# Patient Record
Sex: Female | Born: 1985 | Race: White | Hispanic: No | Marital: Single | Smoking: Current every day smoker
Health system: Southern US, Community
[De-identification: ages and names within clinical notes are randomized; demographics above are authoritative.]

## PROBLEM LIST (undated history)

## (undated) DIAGNOSIS — N200 Calculus of kidney: Secondary | ICD-10-CM

## (undated) HISTORY — PX: OVARIAN CYST REMOVAL: SHX89

## (undated) HISTORY — PX: WISDOM TOOTH EXTRACTION: SHX21

## (undated) HISTORY — PX: LITHOTRIPSY: SUR834

## (undated) HISTORY — PX: TONSILLECTOMY: SUR1361

---

## 2012-06-09 ENCOUNTER — Emergency Department: Payer: Self-pay | Admitting: Emergency Medicine

## 2012-06-09 LAB — BASIC METABOLIC PANEL
Calcium, Total: 8.8 mg/dL (ref 8.5–10.1)
Chloride: 104 mmol/L (ref 98–107)
Co2: 27 mmol/L (ref 21–32)
Creatinine: 0.64 mg/dL (ref 0.60–1.30)
Glucose: 94 mg/dL (ref 65–99)
Osmolality: 273 (ref 275–301)
Potassium: 3.8 mmol/L (ref 3.5–5.1)

## 2012-06-09 LAB — URINALYSIS, COMPLETE
Bilirubin,UR: NEGATIVE
Ph: 7 (ref 4.5–8.0)
Protein: 30
Squamous Epithelial: 1
WBC UR: 23 /HPF (ref 0–5)

## 2012-06-09 LAB — CBC
HCT: 41.5 % (ref 35.0–47.0)
HGB: 14.8 g/dL (ref 12.0–16.0)
MCHC: 35.6 g/dL (ref 32.0–36.0)
MCV: 98 fL (ref 80–100)
RBC: 4.25 10*6/uL (ref 3.80–5.20)
WBC: 13.3 10*3/uL — ABNORMAL HIGH (ref 3.6–11.0)

## 2012-06-11 LAB — URINE CULTURE

## 2012-06-14 ENCOUNTER — Emergency Department: Payer: Self-pay | Admitting: Internal Medicine

## 2012-06-14 LAB — URINALYSIS, COMPLETE
Nitrite: NEGATIVE
Ph: 6 (ref 4.5–8.0)
RBC,UR: 291 /HPF (ref 0–5)
Specific Gravity: 1.013 (ref 1.003–1.030)
Squamous Epithelial: <1
WBC UR: 9 /HPF (ref 0–5)

## 2012-09-05 ENCOUNTER — Emergency Department: Payer: Self-pay | Admitting: Internal Medicine

## 2012-09-05 LAB — URINALYSIS, COMPLETE
Bacteria: NONE SEEN
Bilirubin,UR: NEGATIVE
Glucose,UR: NEGATIVE mg/dL (ref 0–75)
Ketone: NEGATIVE
Leukocyte Esterase: NEGATIVE
Nitrite: NEGATIVE
Ph: 5 (ref 4.5–8.0)
Protein: NEGATIVE
RBC,UR: 58 /HPF (ref 0–5)
Specific Gravity: 1.02 (ref 1.003–1.030)
Squamous Epithelial: 6
WBC UR: NONE SEEN /HPF (ref 0–5)

## 2012-09-05 LAB — CBC
MCH: 32.8 pg (ref 26.0–34.0)
Platelet: 269 10*3/uL (ref 150–440)
RBC: 4.57 10*6/uL (ref 3.80–5.20)
RDW: 12.9 % (ref 11.5–14.5)
WBC: 12.1 10*3/uL — ABNORMAL HIGH (ref 3.6–11.0)

## 2012-09-05 LAB — COMPREHENSIVE METABOLIC PANEL
Albumin: 4.1 g/dL (ref 3.4–5.0)
Alkaline Phosphatase: 93 U/L (ref 50–136)
Anion Gap: 10 (ref 7–16)
BUN: 6 mg/dL — ABNORMAL LOW (ref 7–18)
Calcium, Total: 9.6 mg/dL (ref 8.5–10.1)
Chloride: 103 mmol/L (ref 98–107)
EGFR (Non-African Amer.): >60
Glucose: 84 mg/dL (ref 65–99)
Osmolality: 271 (ref 275–301)
Potassium: 4.1 mmol/L (ref 3.5–5.1)
SGOT(AST): 39 U/L — ABNORMAL HIGH (ref 15–37)
SGPT (ALT): 40 U/L (ref 12–78)
Sodium: 137 mmol/L (ref 136–145)
Total Protein: 7.9 g/dL (ref 6.4–8.2)

## 2012-09-05 LAB — PREGNANCY, URINE: Pregnancy Test, Urine: NEGATIVE m[IU]/mL

## 2012-09-27 ENCOUNTER — Emergency Department: Payer: Self-pay | Admitting: Emergency Medicine

## 2012-09-27 LAB — URINALYSIS, COMPLETE
Bacteria: NONE SEEN
Bilirubin,UR: NEGATIVE
Glucose,UR: NEGATIVE mg/dL (ref 0–75)
Ketone: NEGATIVE
Protein: 30
RBC,UR: 764 /HPF (ref 0–5)
Specific Gravity: 1.019 (ref 1.003–1.030)
Squamous Epithelial: 1
WBC UR: 31 /HPF (ref 0–5)

## 2012-09-27 LAB — COMPREHENSIVE METABOLIC PANEL
Alkaline Phosphatase: 82 U/L (ref 50–136)
Anion Gap: 8 (ref 7–16)
BUN: 6 mg/dL — ABNORMAL LOW (ref 7–18)
Bilirubin,Total: 0.3 mg/dL (ref 0.2–1.0)
Calcium, Total: 8.7 mg/dL (ref 8.5–10.1)
Chloride: 103 mmol/L (ref 98–107)
Glucose: 72 mg/dL (ref 65–99)
SGOT(AST): 32 U/L (ref 15–37)

## 2012-09-27 LAB — CBC
HCT: 40.6 % (ref 35.0–47.0)
HGB: 14.2 g/dL (ref 12.0–16.0)
MCHC: 35 g/dL (ref 32.0–36.0)
MCV: 98 fL (ref 80–100)
Platelet: 288 10*3/uL (ref 150–440)
RBC: 4.14 10*6/uL (ref 3.80–5.20)
RDW: 13.2 % (ref 11.5–14.5)

## 2012-10-11 ENCOUNTER — Emergency Department: Payer: Self-pay | Admitting: Emergency Medicine

## 2012-10-11 LAB — BASIC METABOLIC PANEL
Calcium, Total: 8.7 mg/dL (ref 8.5–10.1)
Creatinine: 0.64 mg/dL (ref 0.60–1.30)
EGFR (African American): >60
Potassium: 3.9 mmol/L (ref 3.5–5.1)
Sodium: 136 mmol/L (ref 136–145)

## 2012-10-11 LAB — URINALYSIS, COMPLETE
Ketone: NEGATIVE
Leukocyte Esterase: NEGATIVE
Ph: 6 (ref 4.5–8.0)
Protein: 100
Specific Gravity: 1.021 (ref 1.003–1.030)
WBC UR: NONE SEEN /HPF (ref 0–5)

## 2012-10-11 LAB — CBC
HCT: 46.4 % (ref 35.0–47.0)
HGB: 16 g/dL (ref 12.0–16.0)
MCHC: 34.5 g/dL (ref 32.0–36.0)
MCV: 99 fL (ref 80–100)
Platelet: 287 10*3/uL (ref 150–440)

## 2012-11-28 ENCOUNTER — Emergency Department: Payer: Self-pay | Admitting: Emergency Medicine

## 2012-11-28 LAB — URINALYSIS, COMPLETE
Bilirubin,UR: NEGATIVE
Glucose,UR: NEGATIVE mg/dL (ref 0–75)
Ketone: NEGATIVE
Nitrite: NEGATIVE
Protein: 30
RBC,UR: 758 /HPF (ref 0–5)
Specific Gravity: 1.018 (ref 1.003–1.030)
WBC UR: 4 /HPF (ref 0–5)

## 2012-11-28 LAB — CBC
RBC: 4.12 10*6/uL (ref 3.80–5.20)
RDW: 13.4 % (ref 11.5–14.5)
WBC: 15 10*3/uL — ABNORMAL HIGH (ref 3.6–11.0)

## 2012-11-28 LAB — BASIC METABOLIC PANEL
Anion Gap: 7 (ref 7–16)
BUN: 8 mg/dL (ref 7–18)
Chloride: 108 mmol/L — ABNORMAL HIGH (ref 98–107)
Co2: 24 mmol/L (ref 21–32)
Creatinine: 0.53 mg/dL — ABNORMAL LOW (ref 0.60–1.30)
EGFR (African American): >60
Glucose: 97 mg/dL (ref 65–99)
Potassium: 3.7 mmol/L (ref 3.5–5.1)
Sodium: 139 mmol/L (ref 136–145)

## 2013-01-26 ENCOUNTER — Emergency Department: Payer: Self-pay | Admitting: Emergency Medicine

## 2013-01-26 LAB — COMPREHENSIVE METABOLIC PANEL
Anion Gap: 7 (ref 7–16)
Bilirubin,Total: 0.2 mg/dL (ref 0.2–1.0)
Chloride: 107 mmol/L (ref 98–107)
Co2: 23 mmol/L (ref 21–32)
Creatinine: 0.77 mg/dL (ref 0.60–1.30)
EGFR (African American): >60
EGFR (Non-African Amer.): >60
Glucose: 79 mg/dL (ref 65–99)
Osmolality: 271 (ref 275–301)
Potassium: 3.9 mmol/L (ref 3.5–5.1)
SGPT (ALT): 27 U/L (ref 12–78)
Sodium: 137 mmol/L (ref 136–145)
Total Protein: 6.8 g/dL (ref 6.4–8.2)

## 2013-01-26 LAB — CBC
MCH: 33.8 pg (ref 26.0–34.0)
MCHC: 34.7 g/dL (ref 32.0–36.0)
MCV: 97 fL (ref 80–100)
Platelet: 285 10*3/uL (ref 150–440)
RBC: 4.06 10*6/uL (ref 3.80–5.20)
RDW: 13.5 % (ref 11.5–14.5)
WBC: 12.2 10*3/uL — ABNORMAL HIGH (ref 3.6–11.0)

## 2013-01-26 LAB — URINALYSIS, COMPLETE
Bilirubin,UR: NEGATIVE
Glucose,UR: NEGATIVE mg/dL (ref 0–75)
Leukocyte Esterase: NEGATIVE
Nitrite: NEGATIVE
WBC UR: 118 /HPF (ref 0–5)

## 2013-01-26 LAB — PREGNANCY, URINE: Pregnancy Test, Urine: NEGATIVE m[IU]/mL

## 2013-03-13 ENCOUNTER — Emergency Department: Payer: Self-pay | Admitting: Emergency Medicine

## 2013-03-13 LAB — COMPREHENSIVE METABOLIC PANEL
ALT: 47 U/L (ref 12–78)
Albumin: 3.9 g/dL (ref 3.4–5.0)
Alkaline Phosphatase: 97 U/L
Anion Gap: 10 (ref 7–16)
BUN: 7 mg/dL (ref 7–18)
Bilirubin,Total: 0.4 mg/dL (ref 0.2–1.0)
CHLORIDE: 103 mmol/L (ref 98–107)
Calcium, Total: 9.1 mg/dL (ref 8.5–10.1)
Co2: 25 mmol/L (ref 21–32)
Creatinine: 0.65 mg/dL (ref 0.60–1.30)
GLUCOSE: 90 mg/dL (ref 65–99)
OSMOLALITY: 273 (ref 275–301)
POTASSIUM: 3.6 mmol/L (ref 3.5–5.1)
SGOT(AST): 41 U/L — ABNORMAL HIGH (ref 15–37)
SODIUM: 138 mmol/L (ref 136–145)
Total Protein: 7.8 g/dL (ref 6.4–8.2)

## 2013-03-13 LAB — CBC
HCT: 45.6 % (ref 35.0–47.0)
HGB: 15.9 g/dL (ref 12.0–16.0)
MCH: 34.9 pg — ABNORMAL HIGH (ref 26.0–34.0)
MCHC: 34.8 g/dL (ref 32.0–36.0)
MCV: 100 fL (ref 80–100)
Platelet: 298 10*3/uL (ref 150–440)
RBC: 4.55 10*6/uL (ref 3.80–5.20)
RDW: 13.1 % (ref 11.5–14.5)
WBC: 11.2 10*3/uL — AB (ref 3.6–11.0)

## 2013-03-13 LAB — URINALYSIS, COMPLETE
Bacteria: NONE SEEN
Bilirubin,UR: NEGATIVE
Glucose,UR: NEGATIVE mg/dL (ref 0–75)
KETONE: NEGATIVE
Leukocyte Esterase: NEGATIVE
NITRITE: NEGATIVE
Ph: 6 (ref 4.5–8.0)
Protein: NEGATIVE
Specific Gravity: 1.006 (ref 1.003–1.030)
Squamous Epithelial: 1

## 2013-04-13 ENCOUNTER — Emergency Department: Payer: Self-pay | Admitting: Emergency Medicine

## 2013-04-13 LAB — URINALYSIS, COMPLETE
Bilirubin,UR: NEGATIVE
Glucose,UR: NEGATIVE mg/dL (ref 0–75)
KETONE: NEGATIVE
LEUKOCYTE ESTERASE: NEGATIVE
Nitrite: NEGATIVE
Ph: 6 (ref 4.5–8.0)
Protein: NEGATIVE
RBC,UR: 1 /HPF (ref 0–5)
Specific Gravity: 1.001 (ref 1.003–1.030)
Squamous Epithelial: NONE SEEN

## 2013-04-13 LAB — COMPREHENSIVE METABOLIC PANEL
ALBUMIN: 3.9 g/dL (ref 3.4–5.0)
ALT: 68 U/L (ref 12–78)
ANION GAP: 8 (ref 7–16)
Alkaline Phosphatase: 98 U/L
BILIRUBIN TOTAL: 0.3 mg/dL (ref 0.2–1.0)
BUN: 10 mg/dL (ref 7–18)
CREATININE: 0.66 mg/dL (ref 0.60–1.30)
Calcium, Total: 9.3 mg/dL (ref 8.5–10.1)
Chloride: 104 mmol/L (ref 98–107)
Co2: 23 mmol/L (ref 21–32)
EGFR (African American): >60
GLUCOSE: 89 mg/dL (ref 65–99)
OSMOLALITY: 269 (ref 275–301)
Potassium: 3.4 mmol/L — ABNORMAL LOW (ref 3.5–5.1)
SGOT(AST): 85 U/L — ABNORMAL HIGH (ref 15–37)
Sodium: 135 mmol/L — ABNORMAL LOW (ref 136–145)
Total Protein: 8 g/dL (ref 6.4–8.2)

## 2013-04-13 LAB — CBC
HCT: 47.3 % — AB (ref 35.0–47.0)
HGB: 16.2 g/dL — AB (ref 12.0–16.0)
MCH: 34.3 pg — AB (ref 26.0–34.0)
MCHC: 34.2 g/dL (ref 32.0–36.0)
MCV: 100 fL (ref 80–100)
Platelet: 310 10*3/uL (ref 150–440)
RBC: 4.73 10*6/uL (ref 3.80–5.20)
RDW: 12.9 % (ref 11.5–14.5)
WBC: 11.4 10*3/uL — ABNORMAL HIGH (ref 3.6–11.0)

## 2013-04-13 LAB — PREGNANCY, URINE: PREGNANCY TEST, URINE: NEGATIVE m[IU]/mL

## 2013-04-19 ENCOUNTER — Emergency Department: Payer: Self-pay | Admitting: Emergency Medicine

## 2013-06-29 ENCOUNTER — Emergency Department: Payer: Self-pay | Admitting: Emergency Medicine

## 2013-06-29 LAB — COMPREHENSIVE METABOLIC PANEL
ALK PHOS: 93 U/L
ALT: 43 U/L (ref 12–78)
ANION GAP: 6 — AB (ref 7–16)
Albumin: 4.1 g/dL (ref 3.4–5.0)
BILIRUBIN TOTAL: 0.6 mg/dL (ref 0.2–1.0)
BUN: 5 mg/dL — ABNORMAL LOW (ref 7–18)
CALCIUM: 9.4 mg/dL (ref 8.5–10.1)
CHLORIDE: 102 mmol/L (ref 98–107)
Co2: 30 mmol/L (ref 21–32)
Creatinine: 0.71 mg/dL (ref 0.60–1.30)
EGFR (African American): >60
GLUCOSE: 89 mg/dL (ref 65–99)
OSMOLALITY: 272 (ref 275–301)
Potassium: 3.9 mmol/L (ref 3.5–5.1)
SGOT(AST): 44 U/L — ABNORMAL HIGH (ref 15–37)
Sodium: 138 mmol/L (ref 136–145)
Total Protein: 8.2 g/dL (ref 6.4–8.2)

## 2013-06-29 LAB — URINALYSIS, COMPLETE
Bilirubin,UR: NEGATIVE
Glucose,UR: NEGATIVE mg/dL (ref 0–75)
Ketone: NEGATIVE
LEUKOCYTE ESTERASE: NEGATIVE
Nitrite: NEGATIVE
Ph: 6 (ref 4.5–8.0)
SPECIFIC GRAVITY: 1.018 (ref 1.003–1.030)
Squamous Epithelial: 1

## 2013-06-29 LAB — CBC
HCT: 47.2 % — AB (ref 35.0–47.0)
HGB: 15.8 g/dL (ref 12.0–16.0)
MCH: 34.1 pg — ABNORMAL HIGH (ref 26.0–34.0)
MCHC: 33.6 g/dL (ref 32.0–36.0)
MCV: 102 fL — AB (ref 80–100)
Platelet: 305 10*3/uL (ref 150–440)
RBC: 4.64 10*6/uL (ref 3.80–5.20)
RDW: 13.6 % (ref 11.5–14.5)
WBC: 13.4 10*3/uL — AB (ref 3.6–11.0)

## 2013-07-02 ENCOUNTER — Emergency Department: Payer: Self-pay | Admitting: Emergency Medicine

## 2013-07-14 ENCOUNTER — Emergency Department: Payer: Self-pay | Admitting: Emergency Medicine

## 2013-07-14 LAB — COMPREHENSIVE METABOLIC PANEL
ALBUMIN: 3.9 g/dL (ref 3.4–5.0)
ALK PHOS: 94 U/L
ANION GAP: 8 (ref 7–16)
BILIRUBIN TOTAL: 0.3 mg/dL (ref 0.2–1.0)
BUN: 7 mg/dL (ref 7–18)
CALCIUM: 9.1 mg/dL (ref 8.5–10.1)
Chloride: 105 mmol/L (ref 98–107)
Co2: 24 mmol/L (ref 21–32)
Creatinine: 0.7 mg/dL (ref 0.60–1.30)
EGFR (Non-African Amer.): >60
Glucose: 90 mg/dL (ref 65–99)
OSMOLALITY: 271 (ref 275–301)
Potassium: 3.9 mmol/L (ref 3.5–5.1)
SGOT(AST): 63 U/L — ABNORMAL HIGH (ref 15–37)
SGPT (ALT): 48 U/L (ref 12–78)
Sodium: 137 mmol/L (ref 136–145)
TOTAL PROTEIN: 8 g/dL (ref 6.4–8.2)

## 2013-07-14 LAB — URINALYSIS, COMPLETE
Bacteria: NONE SEEN
Bilirubin,UR: NEGATIVE
GLUCOSE, UR: NEGATIVE mg/dL (ref 0–75)
Ketone: NEGATIVE
LEUKOCYTE ESTERASE: NEGATIVE
NITRITE: NEGATIVE
Ph: 5 (ref 4.5–8.0)
Protein: 30
RBC,UR: 1638 /HPF (ref 0–5)
Specific Gravity: 1.023 (ref 1.003–1.030)
Squamous Epithelial: 1
WBC UR: 2 /HPF (ref 0–5)

## 2013-07-14 LAB — CBC
HCT: 47.8 % — ABNORMAL HIGH (ref 35.0–47.0)
HGB: 16.4 g/dL — AB (ref 12.0–16.0)
MCH: 34.8 pg — AB (ref 26.0–34.0)
MCHC: 34.3 g/dL (ref 32.0–36.0)
MCV: 101 fL — ABNORMAL HIGH (ref 80–100)
Platelet: 287 10*3/uL (ref 150–440)
RBC: 4.71 10*6/uL (ref 3.80–5.20)
RDW: 13.7 % (ref 11.5–14.5)
WBC: 14.8 10*3/uL — AB (ref 3.6–11.0)

## 2013-08-01 ENCOUNTER — Emergency Department: Payer: Self-pay | Admitting: Emergency Medicine

## 2013-08-01 LAB — COMPREHENSIVE METABOLIC PANEL
ALK PHOS: 83 U/L
ALT: 44 U/L (ref 12–78)
Albumin: 3.6 g/dL (ref 3.4–5.0)
Anion Gap: 7 (ref 7–16)
BUN: 7 mg/dL (ref 7–18)
Bilirubin,Total: 0.4 mg/dL (ref 0.2–1.0)
CALCIUM: 9.6 mg/dL (ref 8.5–10.1)
CREATININE: 0.82 mg/dL (ref 0.60–1.30)
Chloride: 105 mmol/L (ref 98–107)
Co2: 26 mmol/L (ref 21–32)
EGFR (African American): >60
Glucose: 86 mg/dL (ref 65–99)
Osmolality: 273 (ref 275–301)
POTASSIUM: 4.3 mmol/L (ref 3.5–5.1)
SGOT(AST): 51 U/L — ABNORMAL HIGH (ref 15–37)
Sodium: 138 mmol/L (ref 136–145)
Total Protein: 7.7 g/dL (ref 6.4–8.2)

## 2013-08-01 LAB — URINALYSIS, COMPLETE
BACTERIA: NONE SEEN
BILIRUBIN, UR: NEGATIVE
GLUCOSE, UR: NEGATIVE mg/dL (ref 0–75)
Ketone: NEGATIVE
LEUKOCYTE ESTERASE: NEGATIVE
NITRITE: NEGATIVE
PH: 5 (ref 4.5–8.0)
PROTEIN: NEGATIVE
RBC,UR: 662 /HPF (ref 0–5)
Specific Gravity: 1.015 (ref 1.003–1.030)
Squamous Epithelial: 1
WBC UR: 1 /HPF (ref 0–5)

## 2013-08-01 LAB — CBC
HCT: 47.4 % — ABNORMAL HIGH (ref 35.0–47.0)
HGB: 15.5 g/dL (ref 12.0–16.0)
MCH: 33.5 pg (ref 26.0–34.0)
MCHC: 32.7 g/dL (ref 32.0–36.0)
MCV: 102 fL — ABNORMAL HIGH (ref 80–100)
PLATELETS: 313 10*3/uL (ref 150–440)
RBC: 4.64 10*6/uL (ref 3.80–5.20)
RDW: 13.5 % (ref 11.5–14.5)
WBC: 9.4 10*3/uL (ref 3.6–11.0)

## 2013-08-26 ENCOUNTER — Emergency Department: Payer: Self-pay | Admitting: Emergency Medicine

## 2013-08-26 LAB — CBC
HCT: 47.5 % — ABNORMAL HIGH (ref 35.0–47.0)
HGB: 15.9 g/dL (ref 12.0–16.0)
MCH: 34 pg (ref 26.0–34.0)
MCHC: 33.4 g/dL (ref 32.0–36.0)
MCV: 102 fL — ABNORMAL HIGH (ref 80–100)
Platelet: 292 10*3/uL (ref 150–440)
RBC: 4.67 10*6/uL (ref 3.80–5.20)
RDW: 13.4 % (ref 11.5–14.5)
WBC: 12.4 10*3/uL — ABNORMAL HIGH (ref 3.6–11.0)

## 2013-08-26 LAB — COMPREHENSIVE METABOLIC PANEL
AST: 61 U/L — AB (ref 15–37)
Albumin: 3.7 g/dL (ref 3.4–5.0)
Alkaline Phosphatase: 89 U/L
Anion Gap: 6 — ABNORMAL LOW (ref 7–16)
BUN: 7 mg/dL (ref 7–18)
Bilirubin,Total: 0.5 mg/dL (ref 0.2–1.0)
CO2: 26 mmol/L (ref 21–32)
Calcium, Total: 8.8 mg/dL (ref 8.5–10.1)
Chloride: 105 mmol/L (ref 98–107)
Creatinine: 0.78 mg/dL (ref 0.60–1.30)
EGFR (African American): >60
EGFR (Non-African Amer.): >60
Glucose: 105 mg/dL — ABNORMAL HIGH (ref 65–99)
Osmolality: 272 (ref 275–301)
Potassium: 3.4 mmol/L — ABNORMAL LOW (ref 3.5–5.1)
SGPT (ALT): 66 U/L (ref 12–78)
Sodium: 137 mmol/L (ref 136–145)
TOTAL PROTEIN: 7.8 g/dL (ref 6.4–8.2)

## 2013-08-26 LAB — URINALYSIS, COMPLETE
Bilirubin,UR: NEGATIVE
Glucose,UR: NEGATIVE mg/dL (ref 0–75)
KETONE: NEGATIVE
Leukocyte Esterase: NEGATIVE
Nitrite: NEGATIVE
Ph: 5 (ref 4.5–8.0)
RBC,UR: 1647 /HPF (ref 0–5)
SPECIFIC GRAVITY: 1.025 (ref 1.003–1.030)
Squamous Epithelial: 13

## 2013-09-23 ENCOUNTER — Emergency Department: Payer: Self-pay | Admitting: Emergency Medicine

## 2013-09-23 LAB — CBC WITH DIFFERENTIAL/PLATELET
Basophil #: 0.1 10*3/uL (ref 0.0–0.1)
Basophil %: 1 %
EOS PCT: 1.4 %
Eosinophil #: 0.1 10*3/uL (ref 0.0–0.7)
HCT: 42.9 % (ref 35.0–47.0)
HGB: 14.6 g/dL (ref 12.0–16.0)
LYMPHS PCT: 42.6 %
Lymphocyte #: 4.2 10*3/uL — ABNORMAL HIGH (ref 1.0–3.6)
MCH: 34.8 pg — ABNORMAL HIGH (ref 26.0–34.0)
MCHC: 34 g/dL (ref 32.0–36.0)
MCV: 102 fL — AB (ref 80–100)
MONO ABS: 1 x10 3/mm — AB (ref 0.2–0.9)
MONOS PCT: 9.8 %
Neutrophil #: 4.5 10*3/uL (ref 1.4–6.5)
Neutrophil %: 45.2 %
PLATELETS: 331 10*3/uL (ref 150–440)
RBC: 4.19 10*6/uL (ref 3.80–5.20)
RDW: 13.5 % (ref 11.5–14.5)
WBC: 9.9 10*3/uL (ref 3.6–11.0)

## 2013-09-23 LAB — COMPREHENSIVE METABOLIC PANEL
ALBUMIN: 3.3 g/dL — AB (ref 3.4–5.0)
ALT: 50 U/L
AST: 46 U/L — AB (ref 15–37)
Alkaline Phosphatase: 80 U/L
Anion Gap: 7 (ref 7–16)
BUN: 5 mg/dL — ABNORMAL LOW (ref 7–18)
Bilirubin,Total: 0.1 mg/dL — ABNORMAL LOW (ref 0.2–1.0)
CHLORIDE: 108 mmol/L — AB (ref 98–107)
Calcium, Total: 8.6 mg/dL (ref 8.5–10.1)
Co2: 24 mmol/L (ref 21–32)
Creatinine: 0.67 mg/dL (ref 0.60–1.30)
EGFR (Non-African Amer.): >60
Glucose: 97 mg/dL (ref 65–99)
OSMOLALITY: 275 (ref 275–301)
POTASSIUM: 3.3 mmol/L — AB (ref 3.5–5.1)
Sodium: 139 mmol/L (ref 136–145)
Total Protein: 6.7 g/dL (ref 6.4–8.2)

## 2013-09-23 LAB — URINALYSIS, COMPLETE
BACTERIA: NONE SEEN
BILIRUBIN, UR: NEGATIVE
GLUCOSE, UR: NEGATIVE mg/dL (ref 0–75)
Ketone: NEGATIVE
LEUKOCYTE ESTERASE: NEGATIVE
Nitrite: NEGATIVE
PH: 6 (ref 4.5–8.0)
Protein: 30
SPECIFIC GRAVITY: 1.006 (ref 1.003–1.030)
Squamous Epithelial: <1
WBC UR: 22 /HPF (ref 0–5)

## 2013-09-23 LAB — ETHANOL
ETHANOL LVL: 143 mg/dL
Ethanol %: 0.143 % — ABNORMAL HIGH (ref 0.000–0.080)

## 2013-09-23 LAB — PREGNANCY, URINE: Pregnancy Test, Urine: NEGATIVE m[IU]/mL

## 2013-10-19 ENCOUNTER — Emergency Department: Payer: Self-pay | Admitting: Internal Medicine

## 2014-10-26 ENCOUNTER — Emergency Department: Payer: Self-pay

## 2014-10-26 ENCOUNTER — Emergency Department
Admission: EM | Admit: 2014-10-26 | Discharge: 2014-10-26 | Disposition: A | Discharge status: Home / Self Care | Payer: Self-pay | Attending: Emergency Medicine | Admitting: Emergency Medicine

## 2014-10-26 DIAGNOSIS — R109 Unspecified abdominal pain: Secondary | ICD-10-CM

## 2014-10-26 DIAGNOSIS — M545 Low back pain, unspecified: Secondary | ICD-10-CM

## 2014-10-26 DIAGNOSIS — R1011 Right upper quadrant pain: Secondary | ICD-10-CM | POA: Insufficient documentation

## 2014-10-26 DIAGNOSIS — Z3202 Encounter for pregnancy test, result negative: Secondary | ICD-10-CM | POA: Insufficient documentation

## 2014-10-26 DIAGNOSIS — Z880 Allergy status to penicillin: Secondary | ICD-10-CM | POA: Insufficient documentation

## 2014-10-26 DIAGNOSIS — Z720 Tobacco use: Secondary | ICD-10-CM | POA: Insufficient documentation

## 2014-10-26 HISTORY — DX: Calculus of kidney: N20.0

## 2014-10-26 LAB — BASIC METABOLIC PANEL
Anion gap: 8 (ref 5–15)
BUN: 6 mg/dL (ref 6–20)
CHLORIDE: 106 mmol/L (ref 101–111)
CO2: 24 mmol/L (ref 22–32)
CREATININE: 0.59 mg/dL (ref 0.44–1.00)
Calcium: 9.3 mg/dL (ref 8.9–10.3)
GFR calc Af Amer: >60 mL/min (ref >60)
GFR calc non Af Amer: >60 mL/min (ref >60)
Glucose, Bld: 93 mg/dL (ref 65–99)
Potassium: 4.1 mmol/L (ref 3.5–5.1)
SODIUM: 138 mmol/L (ref 135–145)

## 2014-10-26 LAB — URINALYSIS COMPLETE WITH MICROSCOPIC (ARMC ONLY)
Bacteria, UA: NONE SEEN
Bilirubin Urine: NEGATIVE
GLUCOSE, UA: NEGATIVE mg/dL
Hgb urine dipstick: NEGATIVE
Ketones, ur: NEGATIVE mg/dL
Nitrite: NEGATIVE
Protein, ur: NEGATIVE mg/dL
Specific Gravity, Urine: 1.018 (ref 1.005–1.030)
pH: 5 (ref 5.0–8.0)

## 2014-10-26 LAB — CBC
HCT: 48.5 % — ABNORMAL HIGH (ref 35.0–47.0)
Hemoglobin: 16.6 g/dL — ABNORMAL HIGH (ref 12.0–16.0)
MCH: 34.7 pg — ABNORMAL HIGH (ref 26.0–34.0)
MCHC: 34.2 g/dL (ref 32.0–36.0)
MCV: 101.4 fL — AB (ref 80.0–100.0)
Platelets: 263 10*3/uL (ref 150–440)
RBC: 4.78 MIL/uL (ref 3.80–5.20)
RDW: 13.6 % (ref 11.5–14.5)
WBC: 11.5 10*3/uL — ABNORMAL HIGH (ref 3.6–11.0)

## 2014-10-26 LAB — POCT PREGNANCY, URINE: Preg Test, Ur: NEGATIVE

## 2014-10-26 MED ORDER — HYDROCODONE-ACETAMINOPHEN 5-325 MG PO TABS: 1.0000 | ORAL_TABLET | Freq: Four times a day (QID) | ORAL | Status: AC | PRN

## 2014-10-26 MED ORDER — IBUPROFEN 600 MG PO TABS: 600.0000 mg | ORAL_TABLET | Freq: Three times a day (TID) | ORAL | Status: AC | PRN

## 2014-10-26 MED ORDER — ONDANSETRON HCL 4 MG/2ML IJ SOLN
4.0000 mg | Freq: Once | INTRAMUSCULAR | Status: AC
  Administered 2014-10-26: 4 mg via INTRAVENOUS
  Filled 2014-10-26: qty 2

## 2014-10-26 MED ORDER — KETOROLAC TROMETHAMINE 30 MG/ML IJ SOLN: 30.0000 mg | Freq: Once | INTRAMUSCULAR | Status: DC

## 2014-10-26 MED ORDER — MORPHINE SULFATE (PF) 4 MG/ML IV SOLN
4.0000 mg | Freq: Once | INTRAVENOUS | Status: AC
  Administered 2014-10-26: 4 mg via INTRAVENOUS
  Filled 2014-10-26: qty 1

## 2014-10-26 MED ORDER — KETOROLAC TROMETHAMINE 30 MG/ML IJ SOLN
30.0000 mg | Freq: Once | INTRAMUSCULAR | Status: AC
  Administered 2014-10-26: 30 mg via INTRAVENOUS
  Filled 2014-10-26: qty 1

## 2014-10-26 MED ORDER — SODIUM CHLORIDE 0.9 % IV BOLUS (SEPSIS)
1000.0000 mL | Freq: Once | INTRAVENOUS | Status: AC
  Administered 2014-10-26: 1000 mL via INTRAVENOUS

## 2014-10-26 NOTE — ED Notes (Signed)
Pt c/o right flank pain since 4am this morning with N/V.Teresa Kitchenstates she has a hx of kidney stones

## 2014-10-26 NOTE — Discharge Instructions (Signed)
You were evaluated for right flank/back pain, and your exam and evaluation are reassuring today and urged primary. It seems like her pain is localized to the muscles of the right back, such as a muscle spasm or possible pinched nerve.  Although there was initially some abdominal discomfort, your abdominal exam prior to discharge showed no abdominal pain at all. We discussed no CT scan at this point in time, however if you develop any abdominal pain, especially lower abdominal pain, or any fever, vomiting, black or bloody stools, you should return to the emergency department immediately.    Musculoskeletal Pain Musculoskeletal pain is muscle and boney aches and pains. These pains can occur in any part of the body. Your caregiver may treat you without knowing the cause of the pain. They may treat you if blood or urine tests, X-rays, and other tests were normal.  CAUSES There is often not a definite cause or reason for these pains. These pains may be caused by a type of germ (virus). The discomfort may also come from overuse. Overuse includes working out too hard when your body is not fit. Boney aches also come from weather changes. Bone is sensitive to atmospheric pressure changes. HOME CARE INSTRUCTIONS   Ask when your test results will be ready. Make sure you get your test results.  Only take over-the-counter or prescription medicines for pain, discomfort, or fever as directed by your caregiver. If you were given medications for your condition, do not drive, operate machinery or power tools, or sign legal documents for 24 hours. Do not drink alcohol. Do not take sleeping pills or other medications that may interfere with treatment.  Continue all activities unless the activities cause more pain. When the pain lessens, slowly resume normal activities. Gradually increase the intensity and duration of the activities or exercise.  During periods of severe pain, bed rest may be helpful. Lay or sit in any  position that is comfortable.  Putting ice on the injured area.  Put ice in a bag.  Place a towel between your skin and the bag.  Leave the ice on for 15 to 20 minutes, 3 to 4 times a day.  Follow up with your caregiver for continued problems and no reason can be found for the pain. If the pain becomes worse or does not go away, it may be necessary to repeat tests or do additional testing. Your caregiver may need to look further for a possible cause. SEEK IMMEDIATE MEDICAL CARE IF:  You have pain that is getting worse and is not relieved by medications.  You develop chest pain that is associated with shortness or breath, sweating, feeling sick to your stomach (nauseous), or throw up (vomit).  Your pain becomes localized to the abdomen.  You develop any new symptoms that seem different or that concern you. MAKE SURE YOU:   Understand these instructions.  Will watch your condition.  Will get help right away if you are not doing well or get worse. Document Released: 01/28/2005 Document Revised: 04/22/2011 Document Reviewed: 10/02/2012  [facility] Patient Information 2015 [place], [place]. This information is not intended to replace advice given to you by your health care provider. Make sure you discuss any questions you have with your health care provider.

## 2014-10-26 NOTE — ED Provider Notes (Signed)
[facility] Emergency Department Provider Note   ____________________________________________  Time seen: 9:50 AM I have reviewed the triage vital signs and the triage nursing note.  HISTORY  Chief Complaint Flank Pain   Historian Patient  HPI Teresa Johnston is a 29 y.o. female has had a kidney stone in the past, who woke up this morning around 4 AM with right flank and right lower back pain that was sharp and intermittent and felt like her prior kidney stone. She's had some nausea without vomiting. She's had no bowel changes such as cost patient diarrhea. She's had no black or bloody stools. She had no fever here. Senna chest pain or trouble breathing or shortness of breath. She had no pelvic discharge or pelvic complaints. She does have an IUD implanted. Denies dysuria or hematuria    Past Medical History  Diagnosis Date  . Kidney stone     There are no active problems to display for this patient.   Past Surgical History  Procedure Laterality Date  . Tonsillectomy    . Lithotripsy    . Ovarian cyst removal    . Wisdom tooth extraction      Current Outpatient Rx  Name  Route  Sig  Dispense  Refill  . HYDROcodone-acetaminophen (NORCO/VICODIN) 5-325 MG per tablet   Oral   Take 1 tablet by mouth every 6 (six) hours as needed for moderate pain.   4 tablet   0   . ibuprofen (ADVIL,MOTRIN) 600 MG tablet   Oral   Take 1 tablet (600 mg total) by mouth every 8 (eight) hours as needed.   20 tablet   0     Allergies Rocephin; Erythromycin; and Penicillins  No family history on file.  Social History Social History  Substance Use Topics  . Smoking status: Current Every Day Smoker  . Smokeless tobacco: Never Used  . Alcohol Use: Yes    Review of Systems  Constitutional: Negative for fever. Eyes: Negative for visual changes. ENT: Negative for sore throat. Cardiovascular: Negative for chest pain. Respiratory: Negative for shortness of  breath. Gastrointestinal: Negative for vomiting and diarrhea. Genitourinary: Negative for dysuria. Musculoskeletal: Negative for muscular pain to the arms or legs. Skin: Negative for rash. Neurological: Negative for headache. 10 point Review of Systems otherwise negative ____________________________________________   PHYSICAL EXAM:  VITAL SIGNS: ED Triage Vitals  Enc Vitals Group     BP 10/26/14 0842 149/76 mmHg     Pulse Rate 10/26/14 0842 73     Resp 10/26/14 0842 20     Temp 10/26/14 0842 97.7 F (36.5 C)     Temp Source 10/26/14 0842 Oral     SpO2 10/26/14 0842 100 %     Weight 10/26/14 0842 165 lb (74.844 kg)     Height 10/26/14 0842 5\' 2"  (1.575 m)     Head Cir --      Peak Flow --      Pain Score 10/26/14 0842 8     Pain Loc --      Pain Edu? --      Excl. in GC? --      Constitutional: Alert and oriented. Well appearing and in no distress. Eyes: Conjunctivae are normal. PERRL. Normal extraocular movements. ENT   Head: Normocephalic and atraumatic.   Nose: No congestion/rhinnorhea.   Mouth/Throat: Mucous membranes are moist.   Neck: No stridor. Cardiovascular/Chest: Normal rate, regular rhythm.  No murmurs, rubs, or gallops. Respiratory: Normal respiratory effort without  tachypnea nor retractions. Breath sounds are clear and equal bilaterally. No wheezes/rales/rhonchi. Gastrointestinal: Soft. No distention, no guarding, no rebound. Moderate tenderness in the right abdomen and right upper quadrant. No CVA tenderness. No lower abdominal tenderness palpation. No McBurney's point tenderness. Genitourinary/rectal:Deferred Musculoskeletal: Nontender with normal range of motion in all extremities. No joint effusions.  No lower extremity tenderness.  No edema. Neurologic:  Normal speech and language. No gross or focal neurologic deficits are appreciated. Skin:  Skin is warm, dry and intact. No rash noted. Psychiatric: Mood and affect are normal. Speech and  behavior are normal. Patient exhibits appropriate insight and judgment.  ____________________________________________   EKG I, Governor Rooks, MD, the attending physician have personally viewed and interpreted all ECGs.  No EKG performed ____________________________________________  LABS (pertinent positives/negatives)  Basic metabolic panel without significant abnormalities White blood cell count 11.5, hemoglobin 16.6 and platelet count 263 Urinalysis negative for ketones, red blood cells, bacteria. Positive for trace leukocytes and 6-30 white blood cells. Too numerous to count squamous epithelial cells.  ____________________________________________  RADIOLOGY All Xrays were viewed by me. Imaging interpreted by Radiologist.  Ultrasound right upper quadrant: Unremarkable right upper quadrant abdominal ultrasound  Ultrasound RENAL:   IMPRESSION: Small nonobstructing calculus in each kidney. No hydronephrosis. Mild renal cortical thinning on the right is noted without renal cortical thinning on the left. This significance of this finding is uncertain. Renal echogenicity is normal bilaterally. __________________________________________  PROCEDURES  Procedure(s) performed: None  Critical Care performed: None  ____________________________________________   ED COURSE / ASSESSMENT AND PLAN  CONSULTATIONS: None  Pertinent labs & imaging results that were available during my care of the patient were reviewed by me and considered in my medical decision making (see chart for details).  Clinically patient's symptoms do sound like a possible kidney stone, however with no red blood cells in her urinalysis, I do want to proceed further with possible diagnostic testing. I discussed with her obtaining renal ultrasound first. On abdominal exam she does have some right-sided abdominal tenderness especially in the right upper quadrant, I'm adding on LFTs, as well as an ultrasound of the  right upper quadrant. She is no focal point right lower quadrant tenderness on palpation, and I feel appendicitis is less likely clinically.  ----------------------------------------- 2:30 PM on 10/26/2014 -----------------------------------------  On reexamination, patient has no abdominal pain on palpation. No McBurney point tenderness. She does have point tenderness on muscle spasm which is palpable in the right flank and right low back area. I suspect her source of pain is muscular skeletal. I discussed this with her. We discussed CT scan and decided against this at this point in time as I do not suspect an intra-abdominal source of the pain. I'm to treat symptomatically with antibiotics for ibuprofen and 4 doses of Norco as needed.  Patient / Family / Caregiver informed of clinical course, medical decision-making process, and agree with plan.   I discussed return precautions, follow-up instructions, and discharged instructions with patient and/or family.  ___________________________________________   FINAL CLINICAL IMPRESSION(S) / ED DIAGNOSES   Final diagnoses:  Right lateral abdominal pain  Right flank pain  Right-sided low back pain without sciatica       Governor Rooks, MD 10/26/14 1431

## 2014-10-26 NOTE — ED Notes (Signed)
Patient transported to CT 

## 2015-06-07 ENCOUNTER — Emergency Department
Admission: EM | Admit: 2015-06-07 | Discharge: 2015-06-07 | Disposition: A | Discharge status: Home / Self Care | Payer: Self-pay | Attending: Emergency Medicine | Admitting: Emergency Medicine

## 2015-06-07 DIAGNOSIS — F410 Panic disorder [episodic paroxysmal anxiety] without agoraphobia: Secondary | ICD-10-CM | POA: Insufficient documentation

## 2015-06-07 DIAGNOSIS — K529 Noninfective gastroenteritis and colitis, unspecified: Secondary | ICD-10-CM | POA: Insufficient documentation

## 2015-06-07 DIAGNOSIS — F17200 Nicotine dependence, unspecified, uncomplicated: Secondary | ICD-10-CM | POA: Insufficient documentation

## 2015-06-07 HISTORY — DX: Calculus of kidney: N20.0

## 2015-06-07 LAB — COMPREHENSIVE METABOLIC PANEL
ALT: 35 U/L (ref 14–54)
ANION GAP: 13 (ref 5–15)
AST: 34 U/L (ref 15–41)
Albumin: 4.7 g/dL (ref 3.5–5.0)
Alkaline Phosphatase: 76 U/L (ref 38–126)
BILIRUBIN TOTAL: 1 mg/dL (ref 0.3–1.2)
BUN: 6 mg/dL (ref 6–20)
CO2: 23 mmol/L (ref 22–32)
Calcium: 10 mg/dL (ref 8.9–10.3)
Chloride: 103 mmol/L (ref 101–111)
Creatinine, Ser: 0.63 mg/dL (ref 0.44–1.00)
Glucose, Bld: 143 mg/dL — ABNORMAL HIGH (ref 65–99)
POTASSIUM: 4 mmol/L (ref 3.5–5.1)
Sodium: 139 mmol/L (ref 135–145)
TOTAL PROTEIN: 8.3 g/dL — AB (ref 6.5–8.1)

## 2015-06-07 LAB — URINALYSIS COMPLETE WITH MICROSCOPIC (ARMC ONLY)
BACTERIA UA: NONE SEEN
BILIRUBIN URINE: NEGATIVE
GLUCOSE, UA: NEGATIVE mg/dL
HGB URINE DIPSTICK: NEGATIVE
LEUKOCYTES UA: NEGATIVE
NITRITE: NEGATIVE
Protein, ur: 30 mg/dL — AB
SPECIFIC GRAVITY, URINE: 1.026 (ref 1.005–1.030)
pH: 6 (ref 5.0–8.0)

## 2015-06-07 LAB — CBC
HEMATOCRIT: 52.2 % — AB (ref 35.0–47.0)
Hemoglobin: 18.1 g/dL — ABNORMAL HIGH (ref 12.0–16.0)
MCH: 35.4 pg — ABNORMAL HIGH (ref 26.0–34.0)
MCHC: 34.7 g/dL (ref 32.0–36.0)
MCV: 102.1 fL — ABNORMAL HIGH (ref 80.0–100.0)
Platelets: 285 10*3/uL (ref 150–440)
RBC: 5.12 MIL/uL (ref 3.80–5.20)
RDW: 13.1 % (ref 11.5–14.5)
WBC: 10.9 10*3/uL (ref 3.6–11.0)

## 2015-06-07 LAB — POCT PREGNANCY, URINE: PREG TEST UR: NEGATIVE

## 2015-06-07 LAB — LIPASE, BLOOD: Lipase: 16 U/L (ref 11–51)

## 2015-06-07 MED ORDER — PROMETHAZINE HCL 25 MG PO TABS: 25.0000 mg | ORAL_TABLET | ORAL | Status: AC | PRN

## 2015-06-07 MED ORDER — ONDANSETRON HCL 4 MG/2ML IJ SOLN
4.0000 mg | Freq: Once | INTRAMUSCULAR | Status: AC
  Administered 2015-06-07: 4 mg via INTRAVENOUS

## 2015-06-07 MED ORDER — ONDANSETRON HCL 4 MG/2ML IJ SOLN
INTRAMUSCULAR | Status: AC
  Administered 2015-06-07: 4 mg via INTRAVENOUS
  Filled 2015-06-07: qty 2

## 2015-06-07 MED ORDER — MORPHINE SULFATE (PF) 4 MG/ML IV SOLN
INTRAVENOUS | Status: AC
  Filled 2015-06-07: qty 1

## 2015-06-07 MED ORDER — DIAZEPAM 5 MG/ML IJ SOLN
5.0000 mg | Freq: Once | INTRAMUSCULAR | Status: AC
  Administered 2015-06-07: 5 mg via INTRAVENOUS
  Filled 2015-06-07: qty 2

## 2015-06-07 MED ORDER — SODIUM CHLORIDE 0.9 % IV BOLUS (SEPSIS)
1000.0000 mL | Freq: Once | INTRAVENOUS | Status: AC
  Administered 2015-06-07: 1000 mL via INTRAVENOUS

## 2015-06-07 MED ORDER — PROMETHAZINE HCL 25 MG PO TABS
50.0000 mg | ORAL_TABLET | Freq: Once | ORAL | Status: AC
  Administered 2015-06-07: 50 mg via ORAL

## 2015-06-07 MED ORDER — DIAZEPAM 5 MG PO TABS
10.0000 mg | ORAL_TABLET | Freq: Once | ORAL | Status: AC
  Administered 2015-06-07: 10 mg via ORAL

## 2015-06-07 MED ORDER — PROMETHAZINE HCL 25 MG PO TABS
ORAL_TABLET | ORAL | Status: AC
  Administered 2015-06-07: 50 mg via ORAL
  Filled 2015-06-07: qty 2

## 2015-06-07 MED ORDER — DIAZEPAM 5 MG/ML IJ SOLN
INTRAMUSCULAR | Status: AC
  Administered 2015-06-07: 5 mg via INTRAVENOUS
  Filled 2015-06-07: qty 2

## 2015-06-07 MED ORDER — DIAZEPAM 5 MG/ML IJ SOLN
5.0000 mg | Freq: Once | INTRAMUSCULAR | Status: AC
  Administered 2015-06-07: 5 mg via INTRAVENOUS

## 2015-06-07 MED ORDER — MORPHINE SULFATE (PF) 4 MG/ML IV SOLN
4.0000 mg | Freq: Once | INTRAVENOUS | Status: AC
Start: 2015-06-07 — End: 2015-06-07
  Administered 2015-06-07: 4 mg via INTRAVENOUS

## 2015-06-07 MED ORDER — ONDANSETRON HCL 4 MG/2ML IJ SOLN
4.0000 mg | Freq: Once | INTRAMUSCULAR | Status: AC | PRN
  Administered 2015-06-07: 4 mg via INTRAVENOUS
  Filled 2015-06-07: qty 2

## 2015-06-07 MED ORDER — ONDANSETRON HCL 4 MG/2ML IJ SOLN
INTRAMUSCULAR | Status: DC
Start: 2015-06-07 — End: 2015-06-08
  Filled 2015-06-07: qty 2

## 2015-06-07 MED ORDER — LOPERAMIDE HCL 2 MG PO TABS: 2.0000 mg | ORAL_TABLET | Freq: Four times a day (QID) | ORAL | Status: AC | PRN

## 2015-06-07 MED ORDER — SODIUM CHLORIDE 0.9 % IV SOLN
Freq: Once | INTRAVENOUS | Status: AC
  Administered 2015-06-07: 999 mL via INTRAVENOUS

## 2015-06-07 MED ORDER — DIAZEPAM 5 MG PO TABS
ORAL_TABLET | ORAL | Status: AC
  Administered 2015-06-07: 10 mg via ORAL
  Filled 2015-06-07: qty 2

## 2015-06-07 NOTE — ED Notes (Signed)
Pt actively vomiting yellow bile at time of triage.

## 2015-06-07 NOTE — Discharge Instructions (Signed)
Norovirus Infection °A norovirus infection is caused by exposure to a virus in a group of similar viruses (noroviruses). This type of infection causes inflammation in your stomach and intestines (gastroenteritis). Norovirus is the most common cause of gastroenteritis. It also causes food poisoning. °Anyone can get a norovirus infection. It spreads very easily (contagious). You can get it from contaminated food, water, surfaces, or other people. Norovirus is found in the stool or vomit of infected people. You can spread the infection as soon as you feel sick until 2 weeks after you recover.  °Symptoms usually begin within 2 days after you become infected. Most norovirus symptoms affect the digestive system. °CAUSES °Norovirus infection is caused by contact with norovirus. You can catch norovirus if you: °· Eat or drink something contaminated with norovirus. °· Touch surfaces or objects contaminated with norovirus and then put your hand in your mouth. °· Have direct contact with an infected person who has symptoms. °· Share food, drink, or utensils with someone with who is sick with norovirus. °SIGNS AND SYMPTOMS °Symptoms of norovirus may include: °· Nausea. °· Vomiting. °· Diarrhea. °· Stomach cramps. °· Fever. °· Chills. °· Headache. °· Muscle aches. °· Tiredness. °DIAGNOSIS °Your health care provider may suspect norovirus based on your symptoms and physical exam. Your health care provider may also test a sample of your stool or vomit for the virus.  °TREATMENT °There is no specific treatment for norovirus. Most people get better without treatment in about 2 days. °HOME CARE INSTRUCTIONS °· Replace lost fluids by drinking plenty of water or rehydration fluids containing important minerals called electrolytes. This prevents dehydration. Drink enough fluid to keep your urine clear or pale yellow. °· Do not prepare food for others while you are infected. Wait at least 3 days after recovering from the illness to do  that. °PREVENTION  °· Wash your hands often, especially after using the toilet or changing a diaper. °· Wash fruits and vegetables thoroughly before preparing or serving them. °· Throw out any food that a sick person may have touched. °· Disinfect contaminated surfaces immediately after someone in the household has been sick. Use a bleach-based household cleaner. °· Immediately remove and wash soiled clothes or sheets. °SEEK MEDICAL CARE IF: °· Your vomiting, diarrhea, and stomach pain is getting worse. °· Your symptoms of norovirus do not go away after 2-3 days. °SEEK IMMEDIATE MEDICAL CARE IF:  °You develop symptoms of dehydration that do not improve with fluid replacement. This may include: °· Excessive sleepiness. °· Lack of tears. °· Dry mouth. °· Dizziness when standing. °· Weak pulse. °  °This information is not intended to replace advice given to you by your health care provider. Make sure you discuss any questions you have with your health care provider. °  °Document Released: 04/20/2002 Document Revised: 02/18/2014 Document Reviewed: 07/08/2013 °Elsevier Interactive Patient Education ©2016 [employer] ° °

## 2015-06-07 NOTE — ED Notes (Signed)
PT arrives to ER via POV tearful c/o vomiting "nonstop" x 3 hours and lower abdominal cramping.

## 2015-06-07 NOTE — ED Provider Notes (Signed)
[facility] Emergency Department Provider Note        Time seen: ----------------------------------------- 8:13 PM on 06/07/2015 -----------------------------------------    I have reviewed the triage vital signs and the nursing notes.   HISTORY  Chief Complaint Emesis and Abdominal Cramping    HPI Teresa Johnston is a 30 y.o. female who presents ER for vomiting nonstop for the last 3 hours with abdominal cramping and diarrhea. She states she's never had severe vomiting like this before, denies fevers chills or other complaints. Patient presents hyperventilating.   Past Medical History  Diagnosis Date  . Kidney stone   . Kidney stones     There are no active problems to display for this patient.   Past Surgical History  Procedure Laterality Date  . Tonsillectomy    . Lithotripsy    . Ovarian cyst removal    . Wisdom tooth extraction      Allergies Rocephin; Erythromycin; and Penicillins  Social History Social History  Substance Use Topics  . Smoking status: Current Every Day Smoker  . Smokeless tobacco: Never Used  . Alcohol Use: Yes    Review of Systems Constitutional: Negative for fever. Eyes: Negative for visual changes. ENT: Negative for sore throat. Cardiovascular: Negative for chest pain. Respiratory: Negative for shortness of breath. Gastrointestinal: Positive for abdominal pain, vomiting and diarrhea Genitourinary: Negative for dysuria. Musculoskeletal: Negative for back pain. Skin: Negative for rash. Neurological: Negative for headaches, positive for weakness  10-point ROS otherwise negative.  ____________________________________________   PHYSICAL EXAM:  VITAL SIGNS: ED Triage Vitals  Enc Vitals Group     BP 06/07/15 1950 125/94 mmHg     Pulse Rate 06/07/15 1950 54     Resp 06/07/15 1950 16     Temp 06/07/15 1950 98.1 F (36.7 C)     Temp Source 06/07/15 1950 Oral     SpO2 06/07/15 1950 100 %     Weight  06/07/15 1950 170 lb (77.111 kg)     Height 06/07/15 1950  (1.626 m)     Head Cir --      Peak Flow --      Pain Score 06/07/15 1950 10     Pain Loc --      Pain Edu? --      Excl. in GC? --     Constitutional: Anxious, hyperventilating, mild to moderate distress Eyes: Conjunctivae are normal. PERRL. Normal extraocular movements. ENT   Head: Normocephalic and atraumatic.   Nose: No congestion/rhinnorhea.   Mouth/Throat: Mucous membranes are moist.   Neck: No stridor. Cardiovascular: Normal rate, regular rhythm. No murmurs, rubs, or gallops. Respiratory: Normal respiratory effort without tachypnea nor retractions. Breath sounds are clear and equal bilaterally. No wheezes/rales/rhonchi. Gastrointestinal: Nonfocal tenderness, normal bowel sounds. Musculoskeletal: Nontender with normal range of motion in all extremities. No lower extremity tenderness nor edema. Neurologic:  Normal speech and language. No gross focal neurologic deficits are appreciated.  Skin:  Skin is warm, dry and intact. No rash noted. Psychiatric: Anxious mood and affect. ____________________________________________  ED COURSE:  Pertinent labs & imaging results that were available during my care of the patient were reviewed by me and considered in my medical decision making (see chart for details). Patient is no acute distress, she'll receive IV Valium, saline and antiemetics. ____________________________________________    LABS (pertinent positives/negatives)  Labs Reviewed  COMPREHENSIVE METABOLIC PANEL - Abnormal; Notable for the following:    Glucose, Bld 143 (*)    Total  Protein 8.3 (*)    All other components within normal limits  CBC - Abnormal; Notable for the following:    Hemoglobin 18.1 (*)    HCT 52.2 (*)    MCV 102.1 (*)    MCH 35.4 (*)    All other components within normal limits  URINALYSIS COMPLETEWITH MICROSCOPIC ([facility] ONLY) - Abnormal; Notable for the following:     Color, Urine AMBER (*)    APPearance CLEAR (*)    Ketones, ur 2+ (*)    Protein, ur 30 (*)    Squamous Epithelial / LPF 0-5 (*)    All other components within normal limits  LIPASE, BLOOD  POC URINE PREG, ED  POCT PREGNANCY, URINE   ____________________________________________  FINAL ASSESSMENT AND PLAN  Gastroenteritis, panic attack  Plan: Patient with labs as dictated above. Patient does admit to a 40 ounce beer nightly and this is reflected in her blood work with a large MCV. She likely also has Norovirus infection, she is received saline here as well as Phenergan and Valium for her symptoms. I have reiterated her need to cut back on her alcohol intake or get help to quit drinking. Patient will be discharged with antiemetics, antidiarrheal agents and follow-up with her doctor.   Emily FilbertWilliams, Lynisha Osuch E, MD   Note: This dictation was prepared with Dragon dictation. Any transcriptional errors that result from this process are unintentional   Emily FilbertJonathan E Jisselle Poth, MD 06/07/15 2234

## 2016-02-28 IMAGING — CT CT ABD-PELV W/O CM
3 of 4 series · 10 of 46 positions shown, 17 images · non-contrast
Comparison: Prior ultrasound performed earlier on the same day.

CLINICAL DATA: Right flank pain

EXAM:
CT ABDOMEN AND PELVIS WITHOUT CONTRAST
TECHNIQUE: Multidetector CT imaging of the abdomen and pelvis was performed
following the standard protocol without IV contrast.

[Series 4: lung · axial · 0.83mm/px · z∈[-708,-598]mm · 6 of 32 slices shown, 11 images]
[im 5/32  soft-tissue]
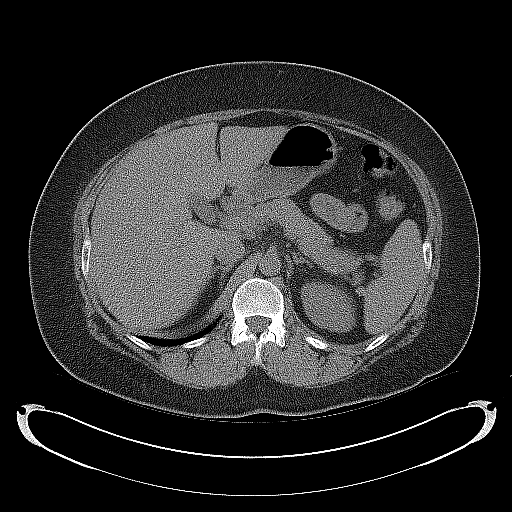
[im 5/32  bone]
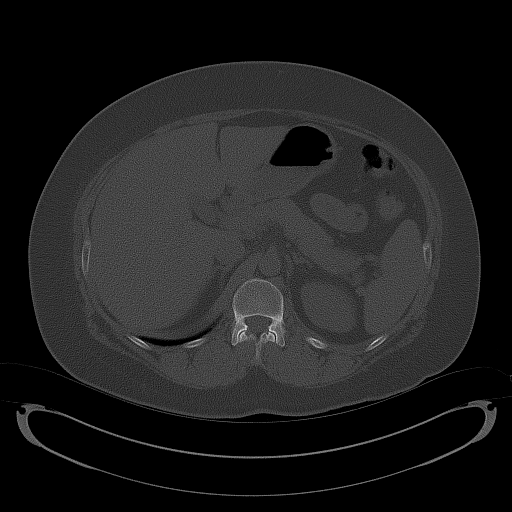
[im 9/32  soft-tissue]
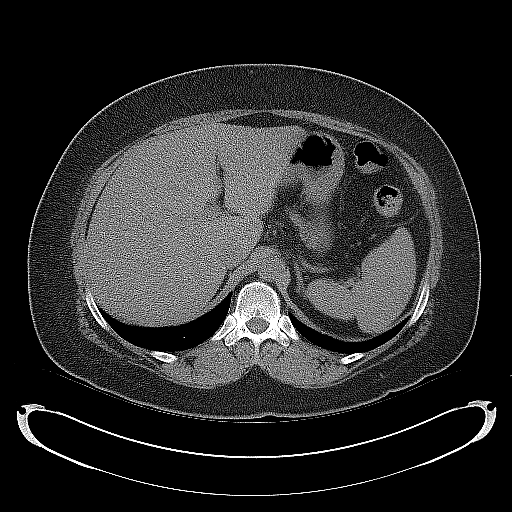
[im 14/32  soft-tissue]
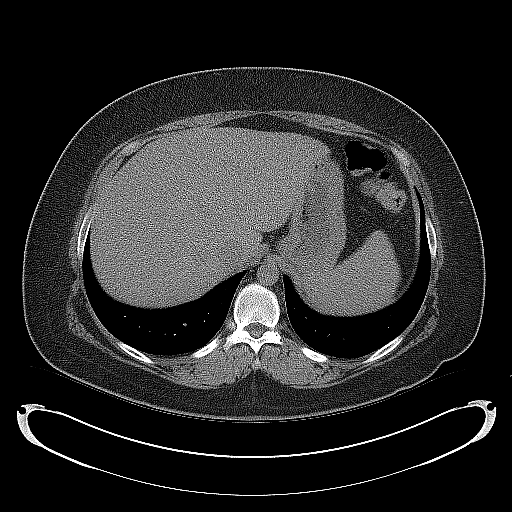
[im 14/32  lung]
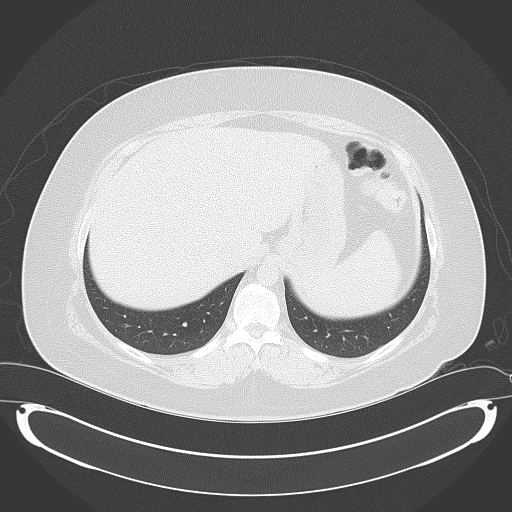
[im 18/32  soft-tissue]
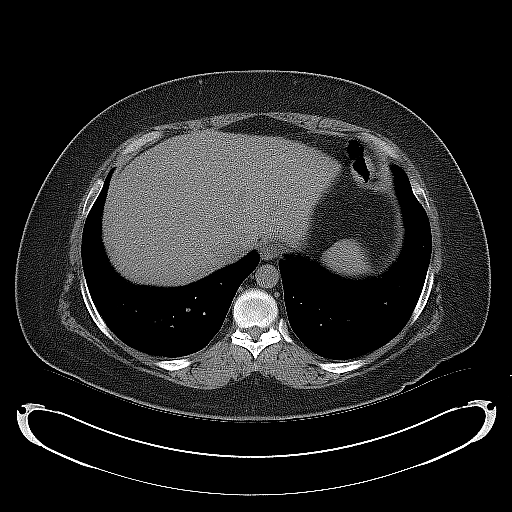
[im 18/32  lung]
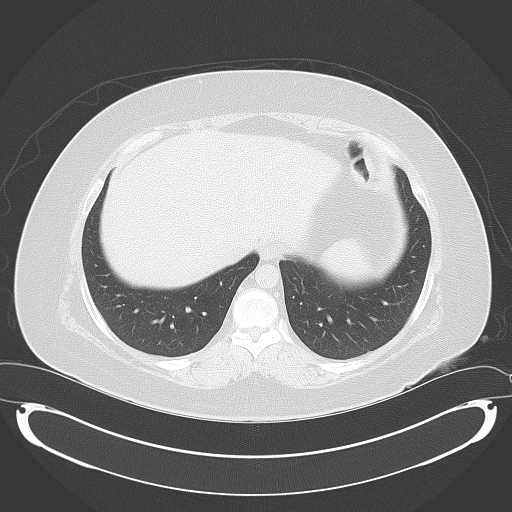
[im 23/32  soft-tissue]
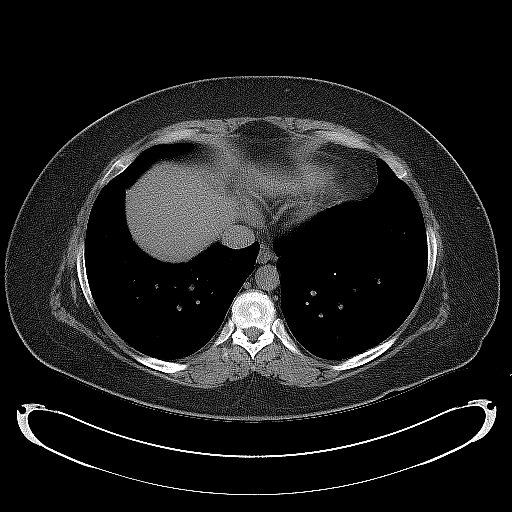
[im 23/32  lung]
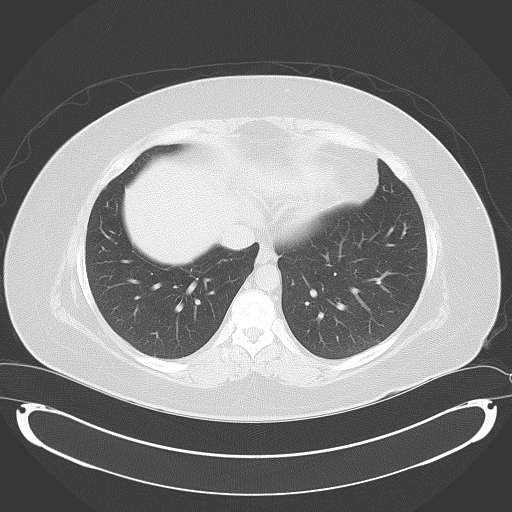
[im 27/32  soft-tissue]
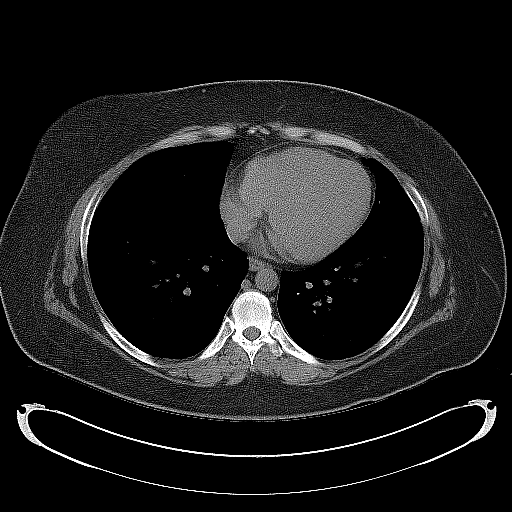
[im 27/32  lung]
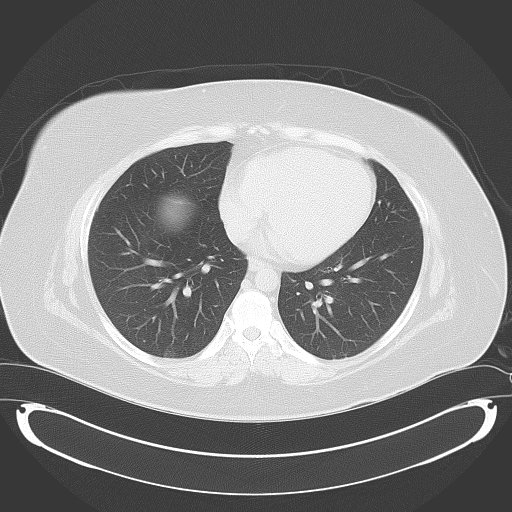

[Series 5: coronal · coronal · 0.81mm/px · 3 of 142 slices shown, 4 images]
[im 48/142  soft-tissue]
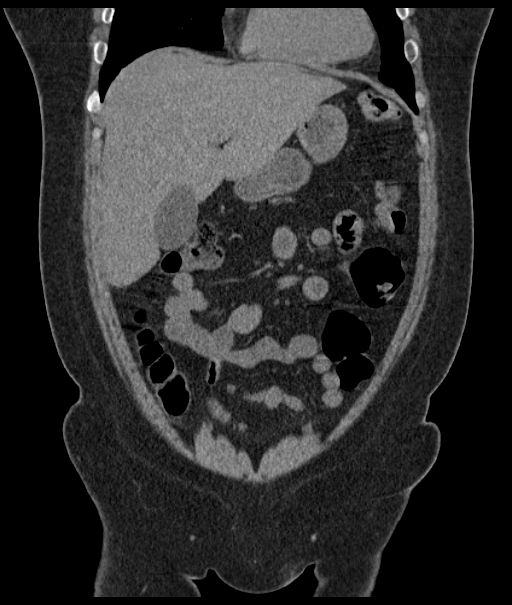
[im 63/142  soft-tissue]
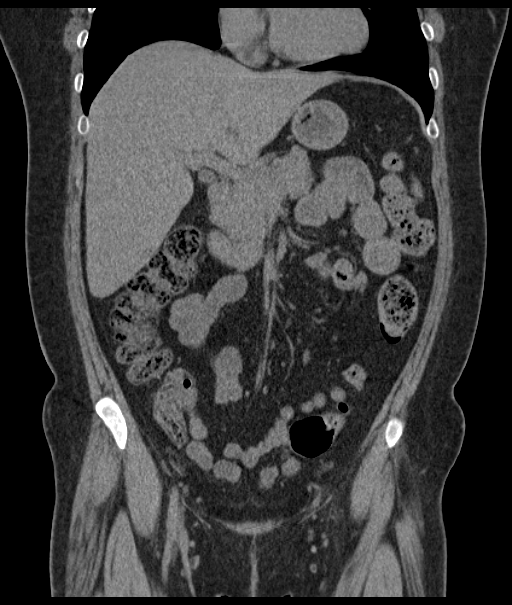
[im 63/142  bone]
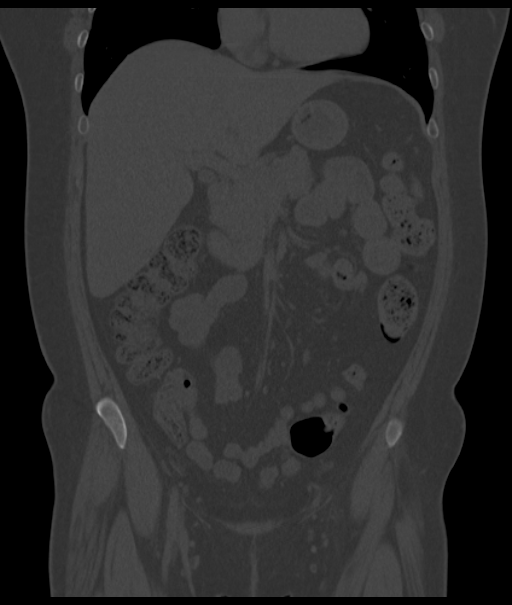
[im 79/142  soft-tissue]
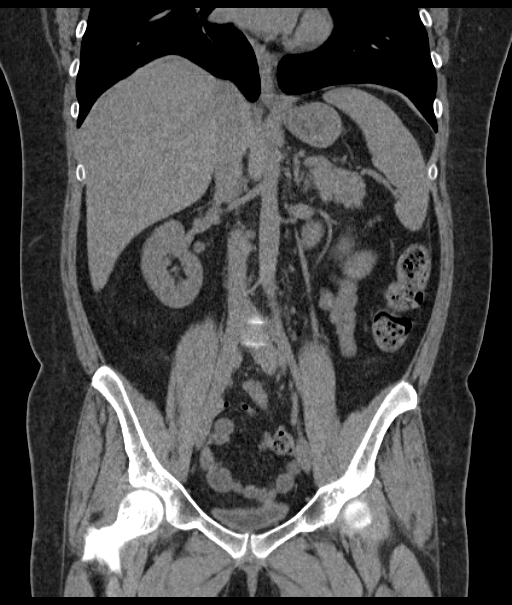

[Series 6: sagittal · sagittal · 0.68mm/px · 1 of 185 slices shown, 2 images]
[im 62/185  soft-tissue]
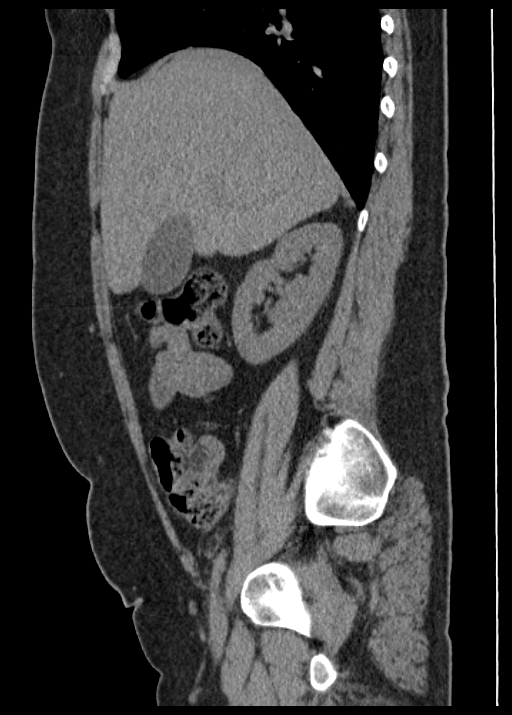
[im 62/185  bone]
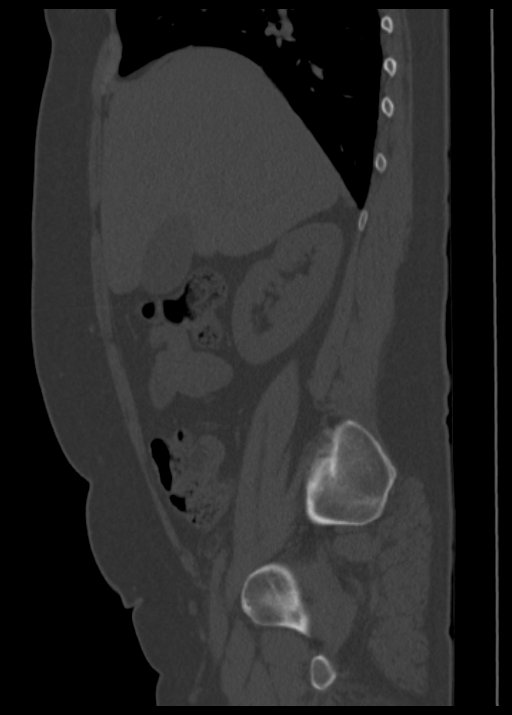

[10 of 46 positions shown; findings below may reference images not displayed]

FINDINGS: The visualized lung bases are clear.

Limited noncontrast evaluation of the liver is unremarkable.
Gallbladder within normal limits. No biliary ductal dilatation. The
spleen, adrenal glands, and pancreas demonstrate a normal unenhanced
appearance.

Single 4 mm calcification present within the right kidney may
represent a right renal stone or parenchymal calcification. No stone
seen along the course of the right renal collecting system. There is
no right-sided hydronephrosis or hydroureter.

On the left, there is a nonobstructive 5 mm calculus. No stones seen
along the course of the left renal collecting system. No left-sided
hydronephrosis or hydroureter.

Stomach within normal limits. No evidence of bowel obstruction.
Appendix is well visualized in the right lower quadrant and is of
normal caliber and appearance without associated inflammatory
changes to suggest acute appendicitis. No abnormal wall thickening
or inflammatory stranding seen about the bowels.

Bladder is largely decompressed but grossly normal. Uterus and
ovaries within normal limits for patient age.

No free air or fluid. No enlarged intra-abdominal pelvic lymph
nodes.

No acute osseous abnormality. No worrisome lytic or blastic osseous
lesions.
IMPRESSION: 1. Possible 4 mm nonobstructive calculus versus parenchymal
calcification within the right kidney. No CT evidence of obstructive
uropathy.
2. 5 mm nonobstructive left renal stone.
3. No other acute intra-abdominal or pelvic process.
4. Normal appendix.

## 2017-10-15 ENCOUNTER — Emergency Department (HOSPITAL_COMMUNITY)
Admission: EM | Admit: 2017-10-15 | Discharge: 2017-10-15 | Disposition: A | Discharge status: Home / Self Care | Payer: Self-pay | Attending: Emergency Medicine | Admitting: Emergency Medicine

## 2017-10-15 ENCOUNTER — Encounter (HOSPITAL_COMMUNITY): Payer: Self-pay | Admitting: Emergency Medicine

## 2017-10-15 ENCOUNTER — Other Ambulatory Visit: Payer: Self-pay

## 2017-10-15 DIAGNOSIS — Z79899 Other long term (current) drug therapy: Secondary | ICD-10-CM | POA: Insufficient documentation

## 2017-10-15 DIAGNOSIS — F17210 Nicotine dependence, cigarettes, uncomplicated: Secondary | ICD-10-CM | POA: Insufficient documentation

## 2017-10-15 DIAGNOSIS — B070 Plantar wart: Secondary | ICD-10-CM | POA: Insufficient documentation

## 2017-10-15 NOTE — Discharge Instructions (Addendum)
Please read attached information. If you experience any new or worsening signs or symptoms please return to the emergency room for evaluation. Please follow-up with your primary care provider or specialist as discussed.  °

## 2017-10-15 NOTE — ED Triage Notes (Signed)
Pt has right small toe swelling, callous, and pain. Worsening over the last month.

## 2017-10-15 NOTE — ED Provider Notes (Signed)
[facility] EMERGENCY DEPARTMENT Provider Note   CSN: [id number] Arrival date & time: 10/15/17  1952     History   Chief Complaint Chief Complaint  Patient presents with  . Toe Pain    HPI Teresa Johnston is a 32 y.o. female.  HPI   32 year old female presents to complaints of right foot pain. Patient notes her last months she's had a callus forming on the right small toe and lateral foot. She notes this is uncomfortable to the point where she cannot wear shoes. She denies any redness or warmth. No history of the same, no trauma.   Past Medical History:  Diagnosis Date  . Kidney stone   . Kidney stones     There are no active problems to display for this patient.   Past Surgical History:  Procedure Laterality Date  . LITHOTRIPSY    . OVARIAN CYST REMOVAL    . TONSILLECTOMY    . WISDOM TOOTH EXTRACTION       OB History   None      Home Medications    Prior to Admission medications   Medication Sig Start Date End Date Taking? Authorizing Provider  HYDROcodone-acetaminophen (NORCO/VICODIN) 5-325 MG per tablet Take 1 tablet by mouth every 6 (six) hours as needed for moderate pain. 10/26/14   Governor Rooks, MD  ibuprofen (ADVIL,MOTRIN) 600 MG tablet Take 1 tablet (600 mg total) by mouth every 8 (eight) hours as needed. 10/26/14   Governor Rooks, MD  loperamide (IMODIUM A-D) 2 MG tablet Take 1 tablet (2 mg total) by mouth 4 (four) times daily as needed for diarrhea or loose stools. 06/07/15   Emily Filbert, MD  promethazine (PHENERGAN) 25 MG tablet Take 1 tablet (25 mg total) by mouth every 4 (four) hours as needed for nausea or vomiting. 06/07/15   Emily Filbert, MD    Family History No family history on file.  Social History Social History   Tobacco Use  . Smoking status: Current Every Day Smoker    Packs/day: 0.50  . Smokeless tobacco: Never Used  Substance Use Topics  . Alcohol use: Yes  . Drug use: No     Allergies     Rocephin [ceftriaxone sodium in dextrose]; Erythromycin; and Penicillins   Review of Systems Review of Systems  All other systems reviewed and are negative.    Physical Exam Updated Vital Signs BP 126/76 (BP Location: Right Arm)   Pulse 63   Temp 98.3 F (36.8 C) (Oral)   Resp 16   SpO2 98%   Physical Exam  Constitutional: She is oriented to person, place, and time. She appears well-developed and well-nourished.  HENT:  Head: Normocephalic and atraumatic.  Eyes: Pupils are equal, round, and reactive to light. Conjunctivae are normal. Right eye exhibits no discharge. Left eye exhibits no discharge. No scleral icterus.  Neck: Normal range of motion. No JVD present. No tracheal deviation present.  Pulmonary/Chest: Effort normal. No stridor.  Musculoskeletal:  Plantar wart on the right lateral foot along the fifth digit- no redness swelling or discharge  Neurological: She is alert and oriented to person, place, and time. Coordination normal.  Psychiatric: She has a normal mood and affect. Her behavior is normal. Judgment and thought content normal.  Nursing note and vitals reviewed.    ED Treatments / Results  Labs (all labs ordered are listed, but only abnormal results are displayed) Labs Reviewed - No data to display  EKG None  Radiology No results found.  Procedures Procedures (including critical care time)  Medications Ordered in ED Medications - No data to display   Initial Impression / Assessment and Plan / ED Course  I have reviewed the triage vital signs and the nursing notes.  Pertinent labs & imaging results that were available during my care of the patient were reviewed by me and considered in my medical decision making (see chart for details).     Labs:   Imaging:  Consults:  Therapeutics:  Discharge Meds:   Assessment/Plan: 8 YOF presents today with plantar wart. Symptomatic care instructions given. Outpatient follow-up information  given. Patient verbalized understanding and agreement to today's plan.      Final Clinical Impressions(s) / ED Diagnoses   Final diagnoses:  Plantar wart    ED Discharge Orders    None       Rosalio Loud 10/15/17 2029    Sabas Sous, MD 10/15/17 2256
